# Patient Record
Sex: Male | Born: 1955 | Race: Black or African American | Hispanic: No | Smoking: Current every day smoker
Health system: Southern US, Community
[De-identification: ages and names within clinical notes are randomized; demographics above are authoritative.]

## PROBLEM LIST (undated history)

## (undated) DIAGNOSIS — G8929 Other chronic pain: Secondary | ICD-10-CM

## (undated) DIAGNOSIS — Z21 Asymptomatic human immunodeficiency virus [HIV] infection status: Secondary | ICD-10-CM

## (undated) DIAGNOSIS — M25569 Pain in unspecified knee: Secondary | ICD-10-CM

## (undated) DIAGNOSIS — B20 Human immunodeficiency virus [HIV] disease: Secondary | ICD-10-CM

## (undated) DIAGNOSIS — M1990 Unspecified osteoarthritis, unspecified site: Secondary | ICD-10-CM

---

## 2013-06-26 ENCOUNTER — Encounter (HOSPITAL_BASED_OUTPATIENT_CLINIC_OR_DEPARTMENT_OTHER): Payer: Self-pay | Admitting: Emergency Medicine

## 2013-06-26 ENCOUNTER — Emergency Department (HOSPITAL_BASED_OUTPATIENT_CLINIC_OR_DEPARTMENT_OTHER): Payer: Self-pay

## 2013-06-26 ENCOUNTER — Emergency Department (HOSPITAL_BASED_OUTPATIENT_CLINIC_OR_DEPARTMENT_OTHER)
Admission: EM | Admit: 2013-06-26 | Discharge: 2013-06-26 | Disposition: A | Discharge status: Home / Self Care | Payer: Self-pay | Attending: Emergency Medicine | Admitting: Emergency Medicine

## 2013-06-26 DIAGNOSIS — S82892A Other fracture of left lower leg, initial encounter for closed fracture: Secondary | ICD-10-CM

## 2013-06-26 DIAGNOSIS — Z21 Asymptomatic human immunodeficiency virus [HIV] infection status: Secondary | ICD-10-CM | POA: Insufficient documentation

## 2013-06-26 DIAGNOSIS — X58XXXA Exposure to other specified factors, initial encounter: Secondary | ICD-10-CM | POA: Insufficient documentation

## 2013-06-26 DIAGNOSIS — S82009A Unspecified fracture of unspecified patella, initial encounter for closed fracture: Secondary | ICD-10-CM | POA: Insufficient documentation

## 2013-06-26 DIAGNOSIS — F17200 Nicotine dependence, unspecified, uncomplicated: Secondary | ICD-10-CM | POA: Insufficient documentation

## 2013-06-26 DIAGNOSIS — Y929 Unspecified place or not applicable: Secondary | ICD-10-CM | POA: Insufficient documentation

## 2013-06-26 DIAGNOSIS — Y939 Activity, unspecified: Secondary | ICD-10-CM | POA: Insufficient documentation

## 2013-06-26 HISTORY — DX: Human immunodeficiency virus (HIV) disease: B20

## 2013-06-26 HISTORY — DX: Asymptomatic human immunodeficiency virus (hiv) infection status: Z21

## 2013-06-26 MED ORDER — HYDROCODONE-ACETAMINOPHEN 5-325 MG PO TABS: 2.0000 | ORAL_TABLET | ORAL | Status: DC | PRN

## 2013-06-26 MED ORDER — MELOXICAM 7.5 MG PO TABS: 7.5000 mg | ORAL_TABLET | Freq: Every day | ORAL | Status: DC

## 2013-06-26 NOTE — ED Provider Notes (Signed)
CSN: [id number]     Arrival date & time 06/26/13  1423 History   First MD Initiated Contact with Patient 06/26/13 1517     Chief Complaint  Patient presents with  . Knee Pain   (Consider location/radiation/quality/duration/timing/severity/associated sxs/prior Treatment) Patient is a 57 y.o. male presenting with knee pain. The history is provided by the patient. No language interpreter was used.  Knee Pain Location:  Knee Knee location:  L knee Pain details:    Quality:  Aching and throbbing   Severity:  Severe   Onset quality:  Gradual   Duration: years.   Timing:  Constant   Progression:  Worsening Foreign body present:  No foreign bodies Worsened by:  Nothing tried Ineffective treatments:  None tried   Past Medical History  Diagnosis Date  . HIV (human immunodeficiency virus infection)    History reviewed. No pertinent past surgical history. No family history on file. History  Substance Use Topics  . Smoking status: Current Every Day Smoker -- 1.00 packs/day  . Smokeless tobacco: Never Used  . Alcohol Use: No    Review of Systems  Musculoskeletal: Positive for joint swelling.  All other systems reviewed and are negative.    Allergies  Review of patient's allergies indicates no known allergies.  Home Medications   Current Outpatient Rx  Name  Route  Sig  Dispense  Refill  . ibuprofen (ADVIL,MOTRIN) 200 MG tablet   Oral   Take 1,200 mg by mouth every 6 (six) hours as needed for pain.          BP 131/89  Pulse 70  Temp(Src) 98.2 F (36.8 C) (Oral)  Resp 16  SpO2 98% Physical Exam  Nursing note and vitals reviewed. Constitutional: He is oriented to person, place, and time. He appears well-developed and well-nourished.  HENT:  Head: Normocephalic.  Musculoskeletal: He exhibits tenderness.  Tender left knee,  Swollen joint,    Neurological: He is alert and oriented to person, place, and time. He has normal reflexes.  Skin: Skin is warm.  Psychiatric:  He has a normal mood and affect.    ED Course  Procedures (including critical care time) Labs Review Labs Reviewed - No data to display Imaging Review Dg Knee Complete 4 Views Left  06/26/2013   *RADIOLOGY REPORT*  Clinical Data: chronic knee pain  LEFT KNEE - COMPLETE 4+ VIEW  Comparison: None.  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 This is most pronounced in the medial compartment.  Normal alignment.  No fracture.  Ossification along the superior aspect of the joint in the patellar region noted measures 2.8 x 2.0 cm.  This could represent an intra-articular loose body.  IMPRESSION: Osteoarthritis as described.  No acute osseous finding.   Original Report Authenticated By: Judie Petit. Miles Costain, M.D.    MDM   1. Knee fracture, left, closed, initial encounter   Schedule to see the Orthopaedist for evaluation.   RX for meloxicam and hydrocodone.      Lonia Skinner [place], PA-C 06/26/13 1550

## 2013-06-26 NOTE — ED Notes (Signed)
Hurt left knee 4 years ago playing basketball.  Takes ibuprofen and elevates when it bothers him.  Pain much worse x1 week.

## 2013-06-27 NOTE — ED Provider Notes (Signed)
Medical screening examination/treatment/procedure(s) were performed by non-physician practitioner and as supervising physician I was immediately available for consultation/collaboration.  Geoffery Lyons, MD 06/27/13 [phone]

## 2013-09-28 ENCOUNTER — Emergency Department (HOSPITAL_BASED_OUTPATIENT_CLINIC_OR_DEPARTMENT_OTHER)
Admission: EM | Admit: 2013-09-28 | Discharge: 2013-09-28 | Disposition: A | Discharge status: Home / Self Care | Payer: Self-pay | Attending: Emergency Medicine | Admitting: Emergency Medicine

## 2013-09-28 ENCOUNTER — Encounter (HOSPITAL_BASED_OUTPATIENT_CLINIC_OR_DEPARTMENT_OTHER): Payer: Self-pay | Admitting: Emergency Medicine

## 2013-09-28 DIAGNOSIS — M25569 Pain in unspecified knee: Secondary | ICD-10-CM | POA: Insufficient documentation

## 2013-09-28 DIAGNOSIS — F17200 Nicotine dependence, unspecified, uncomplicated: Secondary | ICD-10-CM | POA: Insufficient documentation

## 2013-09-28 DIAGNOSIS — G8929 Other chronic pain: Secondary | ICD-10-CM | POA: Insufficient documentation

## 2013-09-28 DIAGNOSIS — Z21 Asymptomatic human immunodeficiency virus [HIV] infection status: Secondary | ICD-10-CM | POA: Insufficient documentation

## 2013-09-28 DIAGNOSIS — Z8739 Personal history of other diseases of the musculoskeletal system and connective tissue: Secondary | ICD-10-CM | POA: Insufficient documentation

## 2013-09-28 HISTORY — DX: Unspecified osteoarthritis, unspecified site: M19.90

## 2013-09-28 HISTORY — DX: Pain in unspecified knee: M25.569

## 2013-09-28 HISTORY — DX: Other chronic pain: G89.29

## 2013-09-28 MED ORDER — OXYCODONE-ACETAMINOPHEN 5-325 MG PO TABS
ORAL_TABLET | ORAL | Status: AC
  Filled 2013-09-28: qty 1

## 2013-09-28 MED ORDER — MELOXICAM 7.5 MG PO TABS: 7.5000 mg | ORAL_TABLET | Freq: Every day | ORAL | Status: DC

## 2013-09-28 MED ORDER — OXYCODONE-ACETAMINOPHEN 5-325 MG PO TABS
1.0000 | ORAL_TABLET | Freq: Once | ORAL | Status: AC
  Administered 2013-09-28: 1 via ORAL

## 2013-09-28 NOTE — ED Provider Notes (Signed)
CSN: [id number]     Arrival date & time 09/28/13  1634 History   First MD Initiated Contact with Patient 09/28/13 1802     Chief Complaint  Patient presents with  . Knee Pain   (Consider location/radiation/quality/duration/timing/severity/associated sxs/prior Treatment) Patient is a 57 y.o. male presenting with knee pain. The history is provided by the patient.  Knee Pain Location:  Knee Time since incident:  1 week Injury: no   Knee location:  R knee and L knee Pain details:    Quality:  Aching   Radiates to:  Does not radiate   Severity:  Moderate   Onset quality:  Gradual   Timing:  Constant Chronicity:  Chronic Dislocation: no   Foreign body present:  No foreign bodies Relieved by:  Nothing Worsened by:  Activity Ineffective treatments:  None tried  Austin Lynch is a 57 y.o. male who presents to the ED with chronic bilateral knee pain. Over the past week the pain has flared up. He has taken nothing for pain.  Medical history positive for HIV, arthritis and chronic knee pain.  Past Medical History  Diagnosis Date  . HIV (human immunodeficiency virus infection)   . Chronic knee pain   . Arthritis    History reviewed. No pertinent past surgical history. No family history on file. History  Substance Use Topics  . Smoking status: Current Every Day Smoker -- 1.00 packs/day  . Smokeless tobacco: Never Used  . Alcohol Use: No    Review of Systems As stated in HPI  Allergies  Review of patient's allergies indicates no known allergies.  Home Medications   Current Outpatient Rx  Name  Route  Sig  Dispense  Refill  . ibuprofen (ADVIL,MOTRIN) 200 MG tablet   Oral   Take 1,200 mg by mouth every 6 (six) hours as needed for pain.          BP 130/78  Pulse 81  Temp(Src) 98.1 F (36.7 C) (Oral)  Resp 16  Ht 5\' 9"  (1.753 m)  Wt 155 lb (70.308 kg)  BMI 22.88 kg/m2  SpO2 98% Physical Exam  Nursing note and vitals reviewed. Constitutional: He is oriented to  person, place, and time. He appears well-developed and well-nourished. No distress.  HENT:  Head: Normocephalic and atraumatic.  Eyes: Conjunctivae and EOM are normal.  Neck: Normal range of motion. Neck supple.  Cardiovascular: Normal rate.   Pulmonary/Chest: Effort normal.  Abdominal: Soft. There is no tenderness.  Musculoskeletal: Normal range of motion.       Legs: Knees tender bilateral over patella with palpation and range of motion. Pedal pulses equal bilateral. Adequate circulation, good touch sensation.   Neurological: He is alert and oriented to person, place, and time. No cranial nerve deficit.  Skin: Skin is warm and dry.  Psychiatric: He has a normal mood and affect. His behavior is normal.    ED Course: knee sleeve for comfort  Procedures  EKG Interpretation   None       MDM  57 y.o. male with chronic knee pain. Discussed with the patient need for his chronic pain to be followed by his PCP. He is stable for discharge without any immediate complications. Patient ambulatory on discharge without any immediate complication. Discussed with the patient and all questioned fully answered.    Medication List    STOP taking these medications       ibuprofen 200 MG tablet  Commonly known as:  ADVIL,MOTRIN  TAKE these medications       meloxicam 7.5 MG tablet  Commonly known as:  MOBIC  Take 1 tablet (7.5 mg total) by mouth daily.           [facility] Orlene Och, [place] 10/01/13 [phone]

## 2013-09-28 NOTE — ED Notes (Signed)
Pt having chronic knee pain, worse over the last week.

## 2013-10-09 NOTE — ED Provider Notes (Signed)
Medical screening examination/treatment/procedure(s) were performed by non-physician practitioner and as supervising physician I was immediately available for consultation/collaboration.  EKG Interpretation   None         Bensen Chadderdon J Anayi Bricco, MD 10/09/13 0102 

## 2014-03-01 ENCOUNTER — Emergency Department (HOSPITAL_BASED_OUTPATIENT_CLINIC_OR_DEPARTMENT_OTHER)
Admission: EM | Admit: 2014-03-01 | Discharge: 2014-03-01 | Disposition: A | Discharge status: Home / Self Care | Payer: Self-pay | Attending: Emergency Medicine | Admitting: Emergency Medicine

## 2014-03-01 ENCOUNTER — Encounter (HOSPITAL_BASED_OUTPATIENT_CLINIC_OR_DEPARTMENT_OTHER): Payer: Self-pay | Admitting: Emergency Medicine

## 2014-03-01 DIAGNOSIS — Y9289 Other specified places as the place of occurrence of the external cause: Secondary | ICD-10-CM | POA: Insufficient documentation

## 2014-03-01 DIAGNOSIS — X500XXA Overexertion from strenuous movement or load, initial encounter: Secondary | ICD-10-CM | POA: Insufficient documentation

## 2014-03-01 DIAGNOSIS — M129 Arthropathy, unspecified: Secondary | ICD-10-CM | POA: Insufficient documentation

## 2014-03-01 DIAGNOSIS — Y9339 Activity, other involving climbing, rappelling and jumping off: Secondary | ICD-10-CM | POA: Insufficient documentation

## 2014-03-01 DIAGNOSIS — G8929 Other chronic pain: Secondary | ICD-10-CM | POA: Insufficient documentation

## 2014-03-01 DIAGNOSIS — Z21 Asymptomatic human immunodeficiency virus [HIV] infection status: Secondary | ICD-10-CM | POA: Insufficient documentation

## 2014-03-01 DIAGNOSIS — F17200 Nicotine dependence, unspecified, uncomplicated: Secondary | ICD-10-CM | POA: Insufficient documentation

## 2014-03-01 DIAGNOSIS — M25562 Pain in left knee: Secondary | ICD-10-CM

## 2014-03-01 DIAGNOSIS — S8990XA Unspecified injury of unspecified lower leg, initial encounter: Secondary | ICD-10-CM | POA: Insufficient documentation

## 2014-03-01 DIAGNOSIS — Z791 Long term (current) use of non-steroidal anti-inflammatories (NSAID): Secondary | ICD-10-CM | POA: Insufficient documentation

## 2014-03-01 DIAGNOSIS — K089 Disorder of teeth and supporting structures, unspecified: Secondary | ICD-10-CM | POA: Insufficient documentation

## 2014-03-01 DIAGNOSIS — S99929A Unspecified injury of unspecified foot, initial encounter: Secondary | ICD-10-CM

## 2014-03-01 DIAGNOSIS — S99919A Unspecified injury of unspecified ankle, initial encounter: Secondary | ICD-10-CM

## 2014-03-01 DIAGNOSIS — K0889 Other specified disorders of teeth and supporting structures: Secondary | ICD-10-CM

## 2014-03-01 MED ORDER — TRAMADOL HCL 50 MG PO TABS
50.0000 mg | ORAL_TABLET | Freq: Four times a day (QID) | ORAL | Status: AC | PRN
Start: 2014-03-01 — End: ?

## 2014-03-01 MED ORDER — PENICILLIN V POTASSIUM 500 MG PO TABS: 500.0000 mg | ORAL_TABLET | Freq: Four times a day (QID) | ORAL | Status: AC

## 2014-03-01 NOTE — ED Provider Notes (Signed)
CSN: [id number]     Arrival date & time 03/01/14  1935 History  This chart was scribed for Ranata Laughery B. Bernette MayersSheldon, MD by Danella Maiersaroline Early, ED Scribe. This patient was seen in room MH04/MH04 and the patient's care was started at 8:43 PM.    Chief Complaint  Patient presents with  . Dental Pain  . Joint Swelling   The history is provided by the patient. No language interpreter was used.   HPI Comments: Austin Lynch is a [age] y.o. male who presents to the Emergency Department complaining of chronic left knee onset one year ago that worsened today when he twisted it while jumping out of a truck bed. He reports a knot to the left knee that has been present for years. He has a knee brace at home.   He also reports right lower dental pain onset a few days ago. He does not have a Education officer, communitydentist.    Past Medical History  Diagnosis Date  . HIV (human immunodeficiency virus infection)   . Chronic knee pain   . Arthritis    History reviewed. No pertinent past surgical history. History reviewed. No pertinent family history. History  Substance Use Topics  . Smoking status: Current Every Day Smoker -- 1.00 packs/day  . Smokeless tobacco: Never Used  . Alcohol Use: No    Review of Systems  HENT: Positive for dental problem.   Musculoskeletal: Positive for arthralgias (left knee).   A complete 10 system review of systems was obtained and all systems are negative except as noted in the HPI and PMH.     Allergies  Review of patient's allergies indicates no known allergies.  Home Medications   Prior to Admission medications   Medication Sig Start Date End Date Taking? Authorizing Provider  meloxicam (MOBIC) 7.5 MG tablet Take 1 tablet (7.5 mg total) by mouth daily. 09/28/13   Hope Orlene OchM Neese, NP   BP 152/88  Pulse 93  Temp(Src) 98.7 F (37.1 C)  Resp 18  SpO2 97% Physical Exam  Nursing note and vitals reviewed. Constitutional: He is oriented to person, place, and time. He appears well-developed and  well-nourished.  HENT:  Head: Normocephalic and atraumatic.  Poor dentition  Eyes: EOM are normal. Pupils are equal, round, and reactive to light.  Neck: Normal range of motion. Neck supple.  Cardiovascular: Normal rate, normal heart sounds and intact distal pulses.   Pulmonary/Chest: Effort normal and breath sounds normal.  Abdominal: Bowel sounds are normal. He exhibits no distension. There is no tenderness.  Musculoskeletal: Normal range of motion. He exhibits tenderness (mild tenderness medially, has a loose FB on anterolateral knee chronic and demonstrated on previous imaging). He exhibits no edema.  Neurological: He is alert and oriented to person, place, and time. He has normal strength. No cranial nerve deficit or sensory deficit.  Skin: Skin is warm and dry. No rash noted.  Psychiatric: He has a normal mood and affect.    ED Course  Procedures (including critical care time) Medications - No data to display  DIAGNOSTIC STUDIES: Oxygen Saturation is 97% on RA, normal by my interpretation.    COORDINATION OF CARE: 8:59 PM- Discussed treatment plan with pt. Pt agrees to plan.    Labs Review Labs Reviewed - No data to display  Imaging Review No results found.   EKG Interpretation None      MDM   Final diagnoses:  Chronic pain of left knee  Toothache   I personally performed the services  described in this documentation, which was scribed in my presence. The recorded information has been reviewed and is accurate.      Vertis Scheib B. Bernette MayersSheldon, MD 03/01/14 2120

## 2014-03-01 NOTE — Discharge Instructions (Signed)
Knee Pain Knee pain can be a result of an injury or other medical conditions. Treatment will depend on the cause of your pain. HOME CARE  Only take medicine as told by your doctor.  Keep a healthy weight. Being overweight can make the knee hurt more.  Stretch before exercising or playing sports.  If there is constant knee pain, change the way you exercise. Ask your doctor for advice.  Make sure shoes fit well. Choose the right shoe for the sport or activity.  Protect your knees. Wear kneepads if needed.  Rest when you are tired. GET HELP RIGHT AWAY IF:   Your knee pain does not stop.  Your knee pain does not get better.  Your knee joint feels hot to the touch.  You have a fever. MAKE SURE YOU:   Understand these instructions.  Will watch this condition.  Will get help right away if you are not doing well or get worse. Document Released: 01/06/2009 Document Revised: 01/02/2012 Document Reviewed: 01/06/2009 [facility] Patient Information 2014 [place], [place].  Toothache Toothaches are usually caused by tooth decay (cavity). However, other causes of toothache include:  Gum disease.  Cracked tooth.  Cracked filling.  Injury.  Jaw problem (temporo mandibular joint or TMJ disorder).  Tooth abscess.  Root sensitivity.  Grinding.  Eruption problems. Swelling and redness around a painful tooth often means you have a dental abscess. Pain medicine and antibiotics can help reduce symptoms, but you will need to see a dentist within the next few days to have your problem properly evaluated and treated. If tooth decay is the problem, you may need a filling or root canal to save your tooth. If the problem is more severe, your tooth may need to be pulled. SEEK IMMEDIATE MEDICAL CARE IF:  You cannot swallow.  You develop severe swelling, increased redness, or increased pain in your mouth or face.  You have a fever.  You cannot open your mouth adequately. Document  Released: 11/17/2004 Document Revised: 01/02/2012 Document Reviewed: 01/07/2010 [facility]ExitCare Patient Information 2014 [place], [place].

## 2014-08-18 IMAGING — CR DG KNEE COMPLETE 4+V*L*
4 series · 4 of 4 positions shown · non-contrast
Comparison: None.

CLINICAL DATA: chronic knee pain

LEFT KNEE - COMPLETE 4+ VIEW

[t knee ap left]
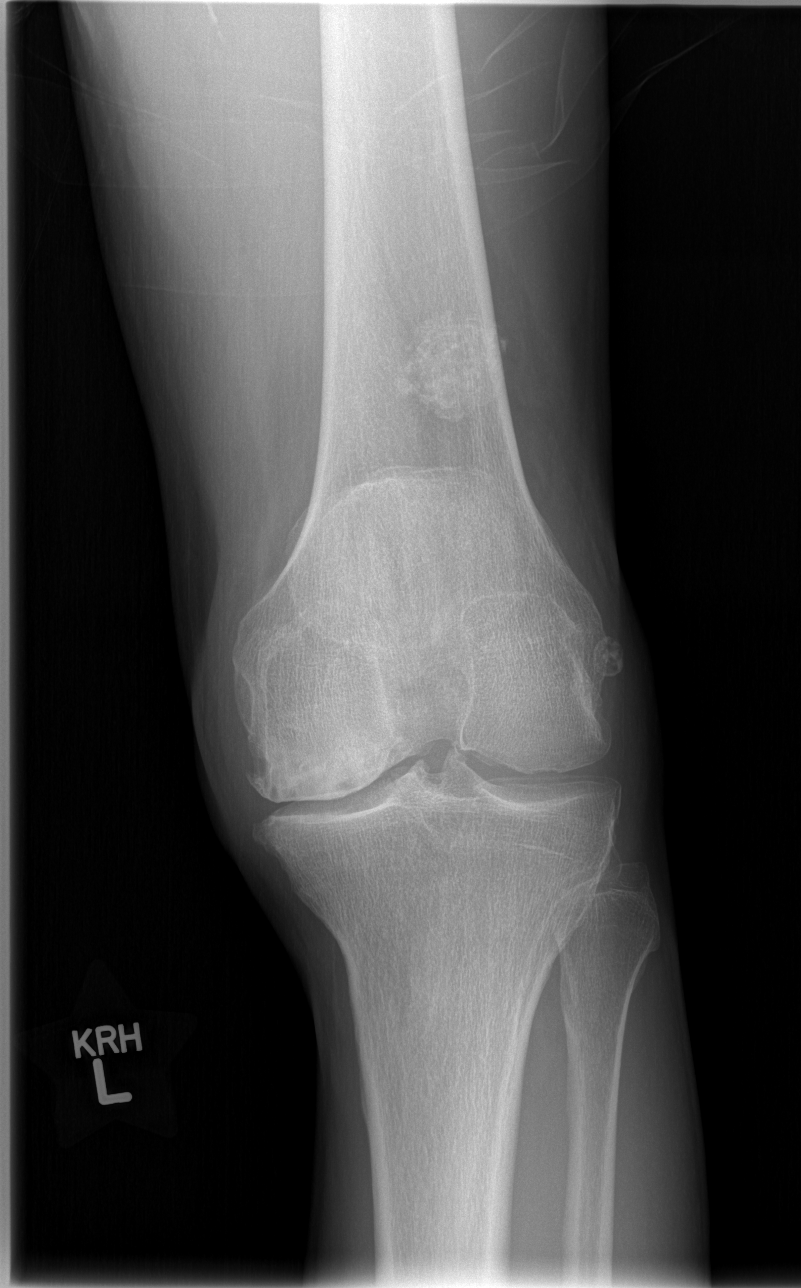

[t knee oblique left (1 of 2)]
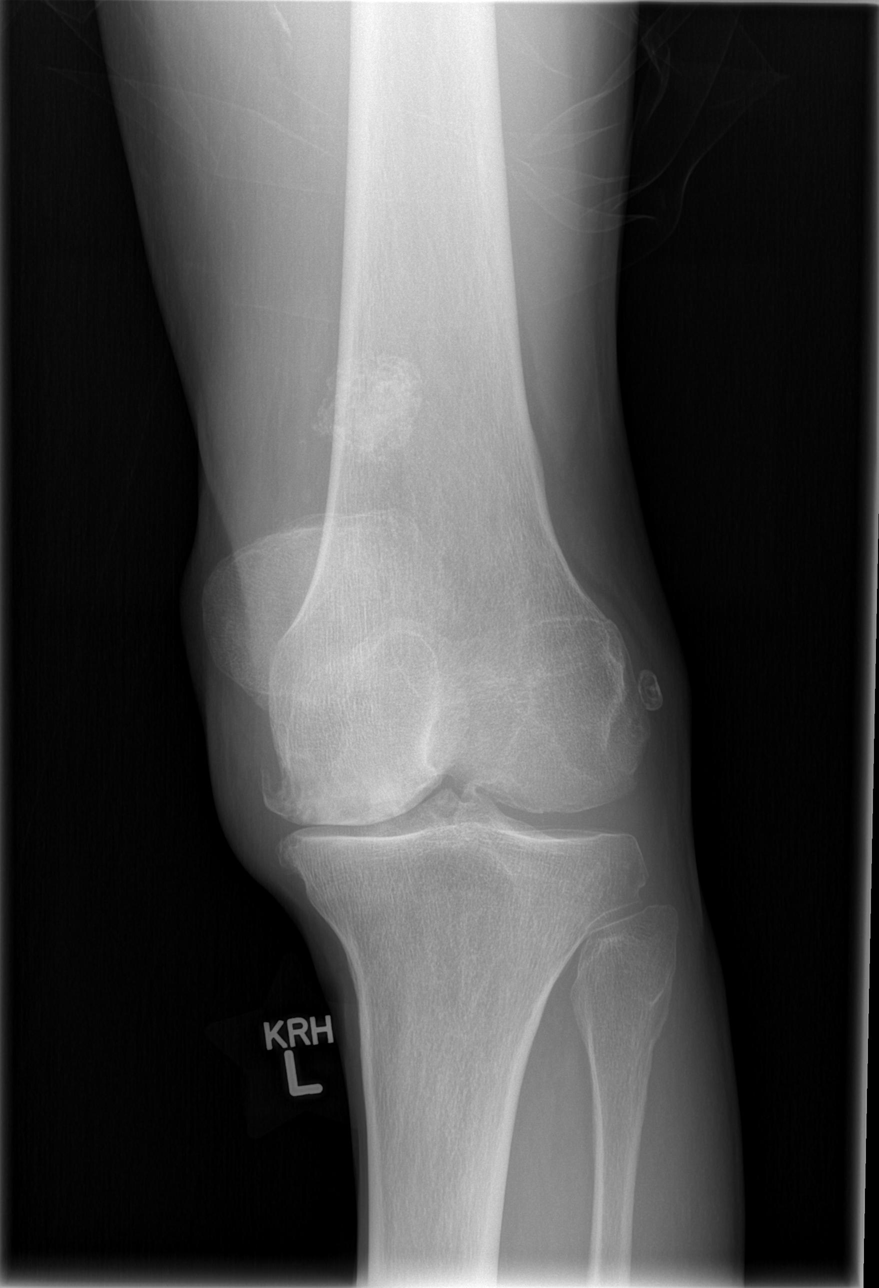

[t knee oblique left (2 of 2)]
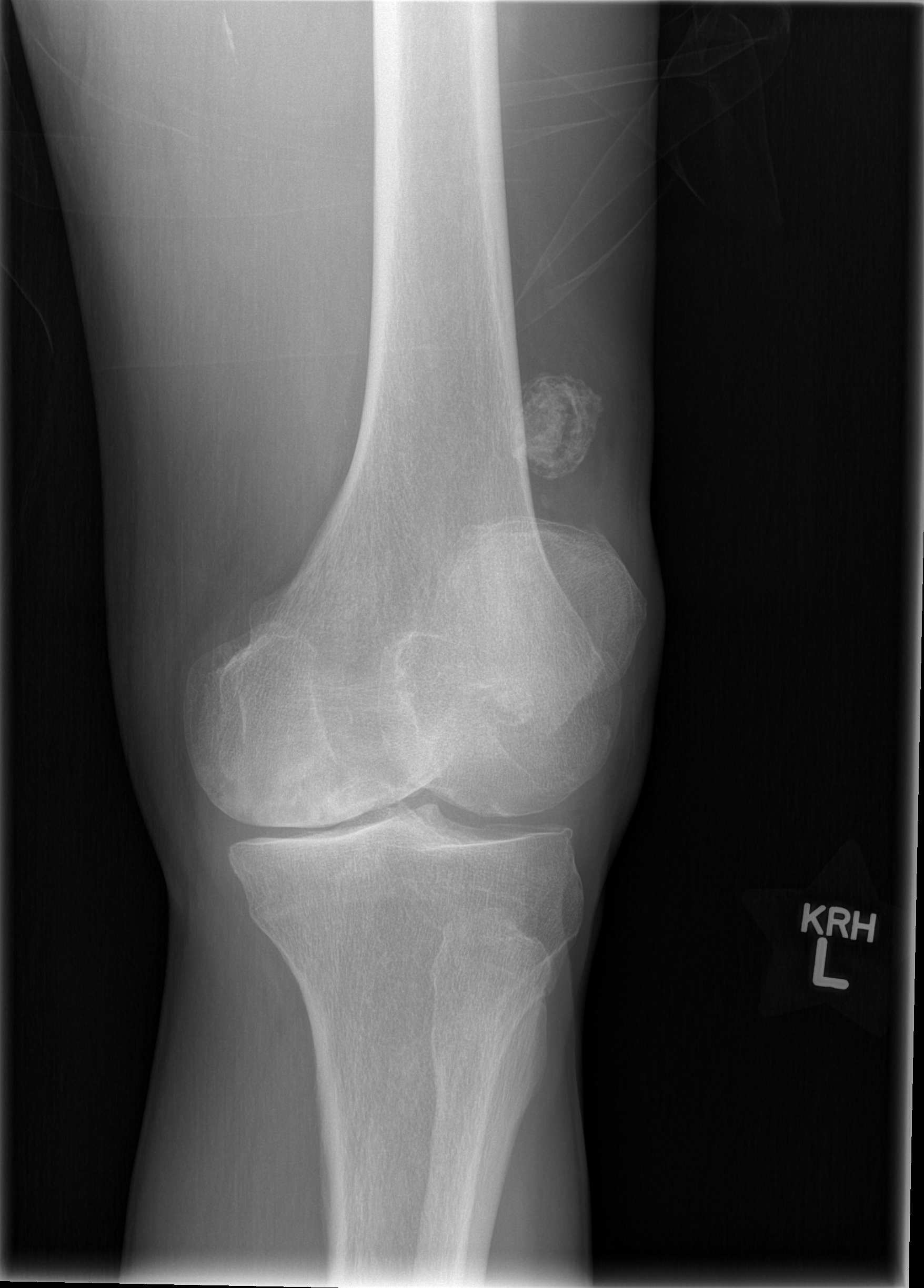

[t knee lat left]
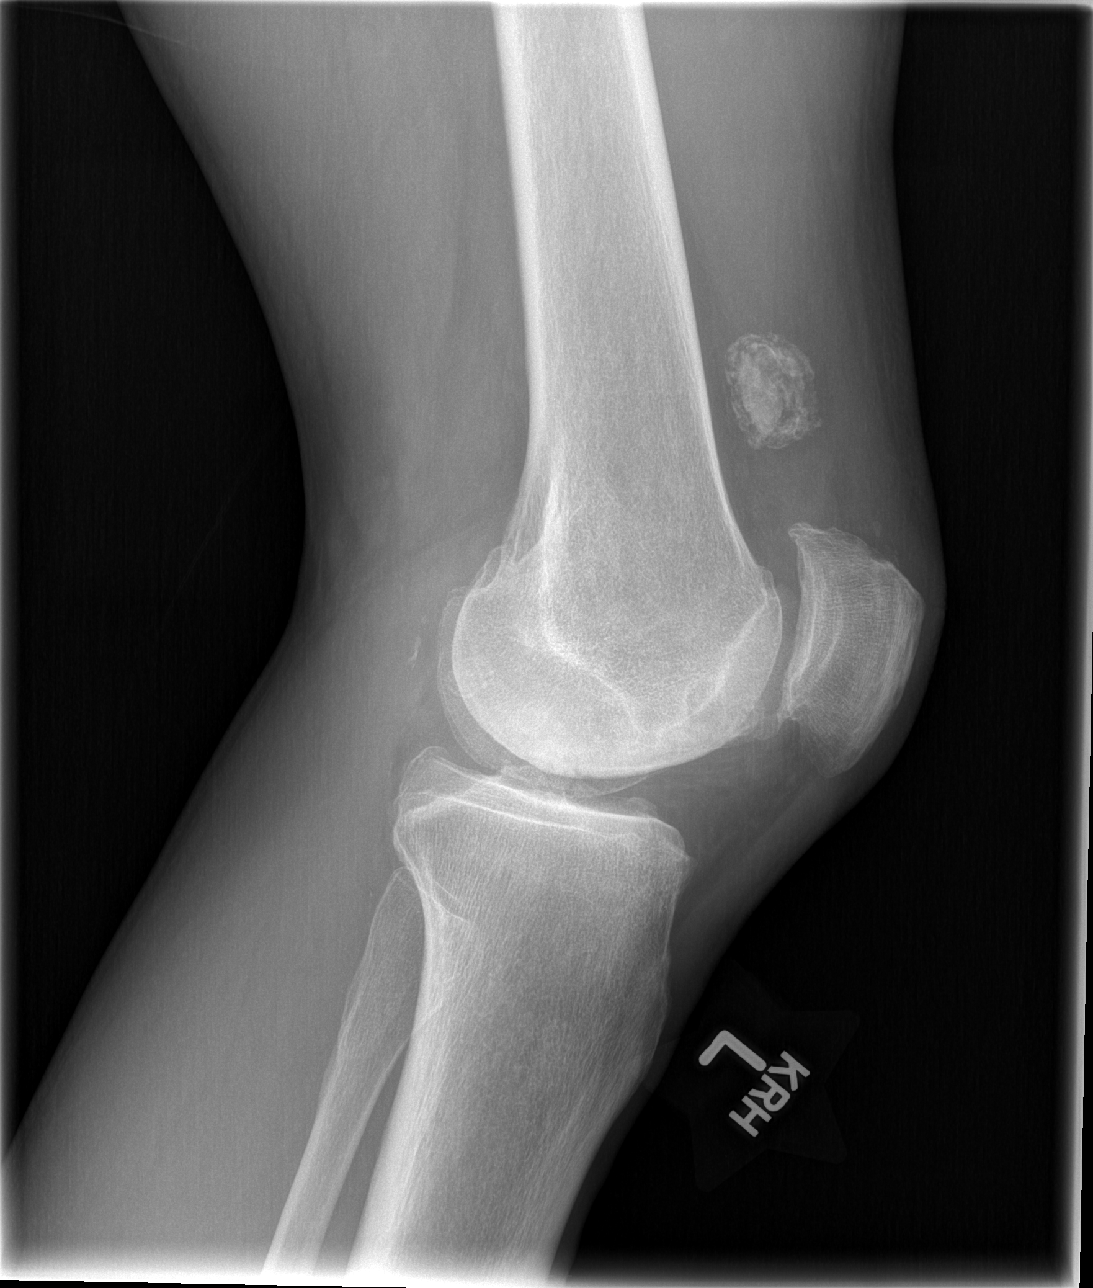

[4 of 4 positions shown; findings below may reference images not displayed]

FINDINGS: Tricompartmental osteoarthritis noted with joint space
loss, sclerosis, and subchondral cyst formation and osteophytes.
This is most pronounced in the medial compartment.  Normal
alignment.  No fracture.  Ossification along the superior aspect of
the joint in the patellar region noted measures 2.8 x 2.0 cm.  This
could represent an intra-articular loose body.
IMPRESSION: Osteoarthritis as described.

No acute osseous finding.

## 2017-11-22 MED ORDER — BISACODYL 5 MG PO TBEC
10.00 | DELAYED_RELEASE_TABLET | ORAL | Status: DC
Start: ? — End: 2017-11-22

## 2017-11-22 MED ORDER — THIAMINE HCL 100 MG PO TABS
100.00 | ORAL_TABLET | ORAL | Status: DC
Start: 2017-11-23 — End: 2017-11-22

## 2017-11-22 MED ORDER — FOLIC ACID 1 MG PO TABS
1.00 | ORAL_TABLET | ORAL | Status: DC
Start: 2017-11-23 — End: 2017-11-22

## 2017-11-22 MED ORDER — ACETAMINOPHEN 325 MG PO TABS
650.00 | ORAL_TABLET | ORAL | Status: DC
Start: ? — End: 2017-11-22

## 2017-11-22 MED ORDER — ATORVASTATIN CALCIUM 40 MG PO TABS
40.00 | ORAL_TABLET | ORAL | Status: DC
Start: 2017-11-22 — End: 2017-11-22

## 2017-11-22 MED ORDER — AMLODIPINE BESYLATE 5 MG PO TABS
5.00 | ORAL_TABLET | ORAL | Status: DC
Start: 2017-11-23 — End: 2017-11-22

## 2017-11-22 MED ORDER — PANTOPRAZOLE SODIUM 40 MG PO TBEC
40.00 | DELAYED_RELEASE_TABLET | ORAL | Status: DC
Start: 2017-11-23 — End: 2017-11-22

## 2017-11-22 MED ORDER — GUAIFENESIN-DM 100-10 MG/5ML PO SYRP
5.00 | ORAL_SOLUTION | ORAL | Status: DC
Start: ? — End: 2017-11-22

## 2017-11-22 MED ORDER — ONDANSETRON HCL 4 MG/2ML IJ SOLN
4.00 | INTRAMUSCULAR | Status: DC
Start: ? — End: 2017-11-22

## 2017-11-22 MED ORDER — POLYETHYLENE GLYCOL 3350 17 G PO PACK
17.00 g | PACK | ORAL | Status: DC
Start: ? — End: 2017-11-22

## 2017-11-22 MED ORDER — ASPIRIN EC 325 MG PO TBEC
325.00 | DELAYED_RELEASE_TABLET | ORAL | Status: DC
Start: 2017-11-23 — End: 2017-11-22

## 2017-11-22 MED ORDER — DOCUSATE SODIUM 100 MG PO CAPS
100.00 | ORAL_CAPSULE | ORAL | Status: DC
Start: ? — End: 2017-11-22

## 2017-12-05 MED ORDER — ACETAMINOPHEN 325 MG PO TABS
650.00 | ORAL_TABLET | ORAL | Status: DC
Start: ? — End: 2017-12-05

## 2017-12-05 MED ORDER — SODIUM CHLORIDE 0.9 % IV SOLN
INTRAVENOUS | Status: DC
Start: ? — End: 2017-12-05

## 2017-12-05 MED ORDER — HYDROCODONE-ACETAMINOPHEN 5-325 MG PO TABS
1.00 | ORAL_TABLET | ORAL | Status: DC
Start: ? — End: 2017-12-05

## 2017-12-05 MED ORDER — THIAMINE HCL 100 MG PO TABS
100.00 | ORAL_TABLET | ORAL | Status: DC
Start: 2017-12-06 — End: 2017-12-05

## 2017-12-05 MED ORDER — PNEUMOCOCCAL VAC POLYVALENT 25 MCG/0.5ML IJ INJ
0.50 | INJECTION | INTRAMUSCULAR | Status: DC
Start: ? — End: 2017-12-05

## 2017-12-05 MED ORDER — PANTOPRAZOLE SODIUM 40 MG PO TBEC
40.00 | DELAYED_RELEASE_TABLET | ORAL | Status: DC
Start: 2017-12-06 — End: 2017-12-05

## 2017-12-05 MED ORDER — FOLIC ACID 1 MG PO TABS
1.00 | ORAL_TABLET | ORAL | Status: DC
Start: 2017-12-06 — End: 2017-12-05

## 2017-12-05 MED ORDER — AMLODIPINE BESYLATE 5 MG PO TABS
5.00 | ORAL_TABLET | ORAL | Status: DC
Start: 2017-12-06 — End: 2017-12-05

## 2017-12-05 MED ORDER — GENERIC EXTERNAL MEDICATION
Status: DC
Start: ? — End: 2017-12-05

## 2017-12-05 MED ORDER — CLOPIDOGREL BISULFATE 75 MG PO TABS
75.00 | ORAL_TABLET | ORAL | Status: DC
Start: 2017-12-06 — End: 2017-12-05

## 2017-12-05 MED ORDER — POLYETHYLENE GLYCOL 3350 17 G PO PACK
17.00 g | PACK | ORAL | Status: DC
Start: 2017-12-06 — End: 2017-12-05

## 2017-12-05 MED ORDER — ONDANSETRON 4 MG PO TBDP
4.00 | ORAL_TABLET | ORAL | Status: DC
Start: ? — End: 2017-12-05

## 2017-12-05 MED ORDER — FERROUS GLUCONATE 324 (38 FE) MG PO TABS
324.00 | ORAL_TABLET | ORAL | Status: DC
Start: 2017-12-06 — End: 2017-12-05

## 2017-12-05 MED ORDER — ENOXAPARIN SODIUM 40 MG/0.4ML ~~LOC~~ SOLN
40.00 | SUBCUTANEOUS | Status: DC
Start: 2017-12-05 — End: 2017-12-05

## 2017-12-05 MED ORDER — ATORVASTATIN CALCIUM 40 MG PO TABS
80.00 | ORAL_TABLET | ORAL | Status: DC
Start: 2017-12-05 — End: 2017-12-05

## 2017-12-13 MED ORDER — FERROUS GLUCONATE 324 (38 FE) MG PO TABS
324.00 | ORAL_TABLET | ORAL | Status: DC
Start: 2017-12-14 — End: 2017-12-13

## 2017-12-13 MED ORDER — ACETAMINOPHEN 325 MG PO TABS
650.00 | ORAL_TABLET | ORAL | Status: DC
Start: ? — End: 2017-12-13

## 2017-12-13 MED ORDER — FOLIC ACID 1 MG PO TABS
1.00 | ORAL_TABLET | ORAL | Status: DC
Start: 2017-12-14 — End: 2017-12-13

## 2017-12-13 MED ORDER — DOCUSATE SODIUM 100 MG PO CAPS
200.00 | ORAL_CAPSULE | ORAL | Status: DC
Start: 2017-12-14 — End: 2017-12-13

## 2017-12-13 MED ORDER — CLOPIDOGREL BISULFATE 75 MG PO TABS
75.00 | ORAL_TABLET | ORAL | Status: DC
Start: 2017-12-14 — End: 2017-12-13

## 2017-12-13 MED ORDER — PANTOPRAZOLE SODIUM 40 MG PO TBEC
40.00 | DELAYED_RELEASE_TABLET | ORAL | Status: DC
Start: 2017-12-14 — End: 2017-12-13

## 2017-12-13 MED ORDER — ONDANSETRON 4 MG PO TBDP
4.00 | ORAL_TABLET | ORAL | Status: DC
Start: ? — End: 2017-12-13

## 2017-12-13 MED ORDER — TRAZODONE HCL 50 MG PO TABS
50.00 | ORAL_TABLET | ORAL | Status: DC
Start: 2017-12-13 — End: 2017-12-13

## 2017-12-13 MED ORDER — POLYETHYLENE GLYCOL 3350 17 G PO PACK
17.00 g | PACK | ORAL | Status: DC
Start: 2017-12-14 — End: 2017-12-13

## 2017-12-13 MED ORDER — AMLODIPINE BESYLATE 5 MG PO TABS
5.00 | ORAL_TABLET | ORAL | Status: DC
Start: 2017-12-14 — End: 2017-12-13

## 2017-12-13 MED ORDER — THIAMINE HCL 100 MG PO TABS
50.00 | ORAL_TABLET | ORAL | Status: DC
Start: 2017-12-13 — End: 2017-12-13

## 2017-12-13 MED ORDER — ATORVASTATIN CALCIUM 40 MG PO TABS
80.00 | ORAL_TABLET | ORAL | Status: DC
Start: 2017-12-13 — End: 2017-12-13

## 2018-01-02 MED ORDER — PANTOPRAZOLE SODIUM 40 MG IV SOLR
40.00 | INTRAVENOUS | Status: DC
Start: 2018-01-02 — End: 2018-01-02

## 2018-01-02 MED ORDER — ATORVASTATIN CALCIUM 40 MG PO TABS
80.00 | ORAL_TABLET | ORAL | Status: DC
Start: 2018-01-02 — End: 2018-01-02

## 2018-01-02 MED ORDER — ONDANSETRON HCL 4 MG/2ML IJ SOLN
4.00 | INTRAMUSCULAR | Status: DC
Start: ? — End: 2018-01-02

## 2018-01-02 MED ORDER — SODIUM CHLORIDE 0.9 % IV SOLN
INTRAVENOUS | Status: DC
Start: ? — End: 2018-01-02

## 2018-01-02 MED ORDER — ACETAMINOPHEN 325 MG PO TABS
650.00 | ORAL_TABLET | ORAL | Status: DC
Start: ? — End: 2018-01-02

## 2018-01-02 MED ORDER — TRAZODONE HCL 50 MG PO TABS
50.00 | ORAL_TABLET | ORAL | Status: DC
Start: ? — End: 2018-01-02

## 2018-01-02 MED ORDER — BUTALBITAL-APAP-CAFFEINE 50-325-40 MG PO TABS
1.00 | ORAL_TABLET | ORAL | Status: DC
Start: ? — End: 2018-01-02

## 2018-01-02 MED ORDER — FOLIC ACID 1 MG PO TABS
1.00 | ORAL_TABLET | ORAL | Status: DC
Start: 2018-01-03 — End: 2018-01-02

## 2018-01-02 MED ORDER — THIAMINE HCL 100 MG PO TABS
100.00 | ORAL_TABLET | ORAL | Status: DC
Start: 2018-01-03 — End: 2018-01-02

## 2018-01-05 MED ORDER — SODIUM CHLORIDE 0.9 % IJ SOLN
5.00 | INTRAMUSCULAR | Status: DC
Start: 2018-01-05 — End: 2018-01-05

## 2018-01-05 MED ORDER — ASPIRIN 81 MG PO CHEW
325.00 | CHEWABLE_TABLET | ORAL | Status: DC
Start: 2018-01-06 — End: 2018-01-05

## 2018-01-05 MED ORDER — PANTOPRAZOLE SODIUM 40 MG IV SOLR
40.00 | INTRAVENOUS | Status: DC
Start: 2018-01-05 — End: 2018-01-05

## 2018-01-05 MED ORDER — SODIUM CHLORIDE 0.9 % IJ SOLN
5.00 | INTRAMUSCULAR | Status: DC
Start: ? — End: 2018-01-05

## 2018-01-05 MED ORDER — SODIUM CHLORIDE 0.9 % IV SOLN
100.00 | INTRAVENOUS | Status: DC
Start: ? — End: 2018-01-05

## 2018-01-05 MED ORDER — NITROGLYCERIN 0.4 MG SL SUBL
0.40 | SUBLINGUAL_TABLET | SUBLINGUAL | Status: DC
Start: ? — End: 2018-01-05

## 2018-01-24 MED ORDER — PANTOPRAZOLE SODIUM 40 MG PO TBEC
40.00 | DELAYED_RELEASE_TABLET | ORAL | Status: DC
Start: 2018-01-25 — End: 2018-01-24

## 2018-01-24 MED ORDER — LISINOPRIL 5 MG PO TABS
10.00 | ORAL_TABLET | ORAL | Status: DC
Start: 2018-01-25 — End: 2018-01-24

## 2018-01-24 MED ORDER — THIAMINE HCL 100 MG PO TABS
100.00 | ORAL_TABLET | ORAL | Status: DC
Start: 2018-01-25 — End: 2018-01-24

## 2018-01-24 MED ORDER — FOLIC ACID 1 MG PO TABS
1.00 | ORAL_TABLET | ORAL | Status: DC
Start: 2018-01-25 — End: 2018-01-24

## 2018-01-24 MED ORDER — ZOLPIDEM TARTRATE 5 MG PO TABS
5.00 | ORAL_TABLET | ORAL | Status: DC
Start: ? — End: 2018-01-24

## 2018-01-24 MED ORDER — HEPARIN SODIUM (PORCINE) 5000 UNIT/ML IJ SOLN
5000.00 | INTRAMUSCULAR | Status: DC
Start: 2018-01-24 — End: 2018-01-24

## 2018-01-24 MED ORDER — HYDROCODONE-ACETAMINOPHEN 5-325 MG PO TABS
1.00 | ORAL_TABLET | ORAL | Status: DC
Start: ? — End: 2018-01-24

## 2018-01-24 MED ORDER — ATORVASTATIN CALCIUM 40 MG PO TABS
80.00 | ORAL_TABLET | ORAL | Status: DC
Start: 2018-01-24 — End: 2018-01-24

## 2018-01-24 MED ORDER — ACETAMINOPHEN 325 MG PO TABS
650.00 | ORAL_TABLET | ORAL | Status: DC
Start: ? — End: 2018-01-24

## 2018-01-24 MED ORDER — DOCUSATE SODIUM 100 MG PO CAPS
100.00 | ORAL_CAPSULE | ORAL | Status: DC
Start: ? — End: 2018-01-24

## 2018-01-24 MED ORDER — ONDANSETRON HCL 4 MG/2ML IJ SOLN
4.00 | INTRAMUSCULAR | Status: DC
Start: ? — End: 2018-01-24

## 2018-01-24 MED ORDER — HYDRALAZINE HCL 20 MG/ML IJ SOLN
10.00 | INTRAMUSCULAR | Status: DC
Start: ? — End: 2018-01-24

## 2018-01-24 MED ORDER — BISACODYL 5 MG PO TBEC
10.00 | DELAYED_RELEASE_TABLET | ORAL | Status: DC
Start: ? — End: 2018-01-24

## 2018-01-24 MED ORDER — ALUMINUM-MAGNESIUM-SIMETHICONE 200-200-20 MG/5ML PO SUSP
30.00 | ORAL | Status: DC
Start: ? — End: 2018-01-24

## 2018-01-24 MED ORDER — CLOPIDOGREL BISULFATE 75 MG PO TABS
75.00 | ORAL_TABLET | ORAL | Status: DC
Start: 2018-01-25 — End: 2018-01-24

## 2018-01-24 MED ORDER — METOPROLOL TARTRATE 25 MG PO TABS
25.00 | ORAL_TABLET | ORAL | Status: DC
Start: 2018-01-24 — End: 2018-01-24

## 2018-01-24 MED ORDER — GUAIFENESIN-DM 100-10 MG/5ML PO SYRP
5.00 | ORAL_SOLUTION | ORAL | Status: DC
Start: ? — End: 2018-01-24

## 2018-01-24 MED ORDER — FERROUS GLUCONATE 324 (38 FE) MG PO TABS
324.00 | ORAL_TABLET | ORAL | Status: DC
Start: 2018-01-25 — End: 2018-01-24

## 2018-02-21 DEATH — deceased
